# Patient Record
Sex: Male | Born: 1990 | Race: White | Hispanic: No | Marital: Single | Smoking: Never smoker
Health system: Southern US, Community
[De-identification: ages and names within clinical notes are randomized; demographics above are authoritative.]

---

## 2022-03-07 ENCOUNTER — Emergency Department
Admission: EM | Admit: 2022-03-07 | Discharge: 2022-03-07 | Disposition: A | Discharge status: Home / Self Care | Payer: Self-pay | Attending: Emergency Medicine | Admitting: Emergency Medicine

## 2022-03-07 ENCOUNTER — Emergency Department: Payer: Self-pay

## 2022-03-07 ENCOUNTER — Encounter: Payer: Self-pay | Admitting: Emergency Medicine

## 2022-03-07 ENCOUNTER — Other Ambulatory Visit: Payer: Self-pay

## 2022-03-07 DIAGNOSIS — M79672 Pain in left foot: Secondary | ICD-10-CM | POA: Insufficient documentation

## 2022-03-07 DIAGNOSIS — M7989 Other specified soft tissue disorders: Secondary | ICD-10-CM | POA: Insufficient documentation

## 2022-03-07 MED ORDER — NAPROXEN 500 MG PO TABS: 500.0000 mg | ORAL_TABLET | Freq: Two times a day (BID) | ORAL | 11 refills | Status: AC

## 2022-03-07 NOTE — Discharge Instructions (Addendum)
-  Keep the boot on while walking or working.  You may utilize the crutches as needed.  Avoid activities that worsen the pain.  Full recovery may take up to 6 to 8 weeks.  -You may take Tylenol and/or naproxen as needed for pain.  Rest and ice the foot periodically for 2 to 3 days.  -Follow-up with the orthopedist listed if your pain fails to improve after 4 to 5 weeks.  -Return to the emergency department anytime if you begin to experience any new or worsening symptoms.

## 2022-03-07 NOTE — ED Provider Notes (Signed)
[facility] Provider Note    Event Date/Time   First MD Initiated Contact with Patient 03/07/22 1534     (approximate)   History   Chief Complaint Foot Pain   HPI Ruben Hatfield is a 31 y.o. male, no remarkable medical history, presents to the emergency department for evaluation of left foot pain.  Patient states that he fell approximately 1 week ago where he slipped while it was raining, but did not have any significant pain since.  However, over the past 2 days, he has been experiencing increased pain and swelling around the dorsum of his left foot.  He states that he works at [employer] and is constantly on his feet most of the days, which worsens his pain.  Denies fever/chills, ankle pain, leg pain, knee pain, hip pain, cold sensation in the affected extremity, numbness/tingling in the affected extremity, or chest pain, shortness of breath.  Denies any recent illnesses.  History Limitations: No limitations.        Physical Exam  Triage Vital Signs: ED Triage Vitals  Enc Vitals Group     BP 03/07/22 1431 (!) 143/92     Pulse Rate 03/07/22 1431 93     Resp 03/07/22 1431 18     Temp 03/07/22 1431 97.6 F (36.4 C)     Temp Source 03/07/22 1431 Oral     SpO2 03/07/22 1431 95 %     Weight 03/07/22 1430 (!) 330 lb (149.7 kg)     Height 03/07/22 1430 5\' 9"  (1.753 m)     Head Circumference --      Peak Flow --      Pain Score 03/07/22 1430 10     Pain Loc --      Pain Edu? --      Excl. in GC? --     Most recent vital signs: Vitals:   03/07/22 1431  BP: (!) 143/92  Pulse: 93  Resp: 18  Temp: 97.6 F (36.4 C)  SpO2: 95%    General: Awake, NAD.  Skin: Warm, dry. No rashes or lesions.  Eyes: PERRL. Conjunctivae normal.  CV: Good peripheral perfusion.  Resp: Normal effort.  Abd: Soft, non-tender. No distention.  Neuro: At baseline. No gross neurological deficits.   Focused Exam: No gross deformities to the left lower extremity.  Mild  swelling appreciated along the dorsum of the left foot.  Bony tenderness along the third and fourth metatarsals.  No warmth or erythema noted.  Pulse, motor, sensation intact distally.  Patient maintains normal flexion and extension of the foot.  He is able to ambulate on his own, though with noticeable pain.   Physical Exam    ED Results / Procedures / Treatments  Labs (all labs ordered are listed, but only abnormal results are displayed) Labs Reviewed - No data to display   EKG N/A.   RADIOLOGY  ED Provider Interpretation: I personally reviewed and interpreted this plain film, no evidence of fractures or dislocations.  DG Foot Complete Left  Result Date: 03/07/2022 CLINICAL DATA:  Left foot pain and swelling, recent fall EXAM: LEFT FOOT - COMPLETE 3+ VIEW COMPARISON:  None Available. FINDINGS: There is no evidence of fracture or dislocation. There is no evidence of arthropathy or other focal bone abnormality. Soft tissues are unremarkable. IMPRESSION: Negative. Electronically Signed   By: 05/07/2022.  Shick M.D.   On: 03/07/2022 15:05    PROCEDURES:  Critical Care performed: N/A.  Procedures    MEDICATIONS  ORDERED IN ED: Medications - No data to display   IMPRESSION / MDM / ASSESSMENT AND PLAN / ED COURSE  I reviewed the triage vital signs and the nursing notes.                              Differential diagnosis includes, but is not limited to, metatarsal fracture, stress fracture, foot sprain, ankle sprain, phalangeal fracture.  ED Course Patient appears well, vitals within normal limits for the patient.  NAD.  Assessment/Plan Presentation suspicious for stress fracture of the left metatarsals, particularly the third and fourth.  X-ray does not show any obvious fractures or dislocations at this time.  He is neurovascularly intact.  We will provide him with a cam boot and crutches.  We will provide him with a prescription for naproxen, as well as provided referral to  orthopedics if his pain fails to improve after a few weeks.  Patient was amenable to this plan.  We will plan to discharge.  Patient's presentation is most consistent with acute complicated illness / injury requiring diagnostic workup.   Provided the patient with anticipatory guidance, return precautions, and educational material. Encouraged the patient to return to the emergency department at any time if they begin to experience any new or worsening symptoms. Patient expressed understanding and agreed with the plan.       FINAL CLINICAL IMPRESSION(S) / ED DIAGNOSES   Final diagnoses:  Foot pain, left     Rx / DC Orders   ED Discharge Orders          Ordered    naproxen (NAPROSYN) 500 MG tablet  2 times daily with meals        03/07/22 1613             Note:  This document was prepared using Dragon voice recognition software and may include unintentional dictation errors.   Varney Daily, [place] 03/07/22 1622    Georga Hacking, MD 03/07/22 2001

## 2022-03-07 NOTE — ED Triage Notes (Signed)
Pt via POV from home. Pt c/o L foot pain and swelling  states he fell a week ago where he slipped when it was raining but unknown if it was from that. States that it started getting worse yesterday. Pt works at [employer] and is on his feet majority of the days. Unable to bear any weight on it. Pt is A&Ox4 and NAD

## 2022-06-16 ENCOUNTER — Ambulatory Visit (INDEPENDENT_AMBULATORY_CARE_PROVIDER_SITE_OTHER): Payer: Worker's Compensation | Admitting: Podiatry

## 2022-06-16 ENCOUNTER — Ambulatory Visit (INDEPENDENT_AMBULATORY_CARE_PROVIDER_SITE_OTHER): Payer: Self-pay

## 2022-06-16 ENCOUNTER — Encounter: Payer: Self-pay | Admitting: *Deleted

## 2022-06-16 DIAGNOSIS — S99922S Unspecified injury of left foot, sequela: Secondary | ICD-10-CM

## 2022-06-16 NOTE — Progress Notes (Signed)
  Subjective:  Patient ID: Ruben Hatfield, male    DOB: [birth year],  MRN: [id number]  Chief Complaint  Patient presents with   left foot pain    left foot injury/ pain and swelling/ slipped and fell   Foot Pain    Injured foot back in June pt stated he slipped and fell and has been having pain and swelling ever since     31 y.o. male presents with the above complaint.  Patient presents with complaint of left dorsal foot contusion after injury he sustained while working for [employer] on 03/07/2022.  He has slipped and fell and is still having some pain and swelling.  He wanted to get it evaluated.  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 patient.  He denies any other acute complaints.  It hurts with ambulation sometimes is mostly swollen.  He is taking Naprosyn to help with the pain.  He has been wearing a boot for last 3 months   Review of Systems: Negative except as noted in the HPI. Denies N/V/F/Ch.  No past medical history on file.  Current Outpatient Medications:    naproxen (NAPROSYN) 500 MG tablet, Take 1 tablet (500 mg total) by mouth 2 (two) times daily with a meal., Disp: 60 tablet, Rfl: 11  Social History   Tobacco Use  Smoking Status Never  Smokeless Tobacco Current    Allergies  Allergen Reactions   Bee Venom    Penicillins    Objective:  There were no vitals filed for this visit. There is no height or weight on file to calculate BMI. Constitutional Well developed. Well nourished.  Vascular Dorsalis pedis pulses palpable bilaterally. Posterior tibial pulses palpable bilaterally. Capillary refill normal to all digits.  No cyanosis or clubbing noted. Pedal hair growth normal.  Neurologic Normal speech. Oriented to person, place, and time. Epicritic sensation to light touch grossly present bilaterally.  Dermatologic Nails well groomed and normal in appearance. No open wounds. No skin lesions.  Orthopedic: Mild pain on palpation to the dorsal lateral foot.  No pain with  range of motion of the second metatarsal phalangeal joint through fifth metatarsophalangeal joint.  No extensor or flexor tendinitis noted.   Radiographs: 3 views of skeletally mature adult left foot: No Lisfranc injury noted.  No gapping of the Lisfranc interval noted.  No bony fractures identified.  No foreign body noted.  No other bony abnormalities identified. Assessment:   1. Foot injury, left, sequela    Plan:  Patient was evaluated and treated and all questions answered.  Left foot contusion -All questions and concerns were discussed with the patient in extensive detail.  Patient states the cam boot immobilization did help but he would like to transition out of the boot.  I discussed with him he will benefit from an ankle brace to help with the transition.  Ultimately he is driving to [place] for the next month and a half and he cannot be in a boot for that long.  I discussed with him the swelling can last up to a year after the injury.  His pain is very controllable minimal. -If there is no improvement we will discuss steroid injection versus an MRI given that this has been few months since the original injury  No follow-ups on file.

## 2022-08-04 ENCOUNTER — Encounter: Payer: Self-pay | Admitting: Podiatry

## 2022-08-04 ENCOUNTER — Ambulatory Visit (INDEPENDENT_AMBULATORY_CARE_PROVIDER_SITE_OTHER): Payer: Self-pay | Admitting: Podiatry

## 2022-08-04 DIAGNOSIS — S99922S Unspecified injury of left foot, sequela: Secondary | ICD-10-CM

## 2022-08-04 NOTE — Progress Notes (Signed)
  Subjective:  Patient ID: Ruben Hatfield, male    DOB: 28-May-1991,  MRN: [id number]  Chief Complaint  Patient presents with   Foot Injury    31 y.o. male presents with the above complaint.  Patient presents with follow-up of left dorsal foot contusion.  He states that he is doing much better.  He states that he still has a little bit of residual pain but manageable.  He would like to know if he can return back to work.  Denies any other acute issues.   Review of Systems: Negative except as noted in the HPI. Denies N/V/F/Ch.  No past medical history on file.  Current Outpatient Medications:    naproxen (NAPROSYN) 500 MG tablet, Take 1 tablet (500 mg total) by mouth 2 (two) times daily with a meal., Disp: 60 tablet, Rfl: 11  Social History   Tobacco Use  Smoking Status Never  Smokeless Tobacco Current    Allergies  Allergen Reactions   Bee Venom    Penicillins    Objective:  There were no vitals filed for this visit. There is no height or weight on file to calculate BMI. Constitutional Well developed. Well nourished.  Vascular Dorsalis pedis pulses palpable bilaterally. Posterior tibial pulses palpable bilaterally. Capillary refill normal to all digits.  No cyanosis or clubbing noted. Pedal hair growth normal.  Neurologic Normal speech. Oriented to person, place, and time. Epicritic sensation to light touch grossly present bilaterally.  Dermatologic Nails well groomed and normal in appearance. No open wounds. No skin lesions.  Orthopedic: No further pain on palpation to the dorsal lateral foot.  No pain with range of motion of the second metatarsal phalangeal joint through fifth metatarsophalangeal joint.  No extensor or flexor tendinitis noted.   Radiographs: 3 views of skeletally mature adult left foot: No Lisfranc injury noted.  No gapping of the Lisfranc interval noted.  No bony fractures identified.  No foreign body noted.  No other bony abnormalities  identified. Assessment:   1. Foot injury, left, sequela     Plan:  Patient was evaluated and treated and all questions answered.  Left foot contusion -All questions and concerns were discussed with the patient in extensive detail.  Patient states that he is doing well.  He is has some residual pain overall but still doing much better.  Denies any other acute complaints.  He would like to return back to work.  If any foot and ankle issues on future of asked him to come back and see me.  He states understanding  No follow-ups on file.

## 2023-08-31 IMAGING — CR DG FOOT COMPLETE 3+V*L*
3 series · 3 of 3 positions shown · non-contrast
Comparison: None Available.

CLINICAL DATA: Left foot pain and swelling, recent fall

EXAM:
LEFT FOOT - COMPLETE 3+ VIEW

[foot ap]
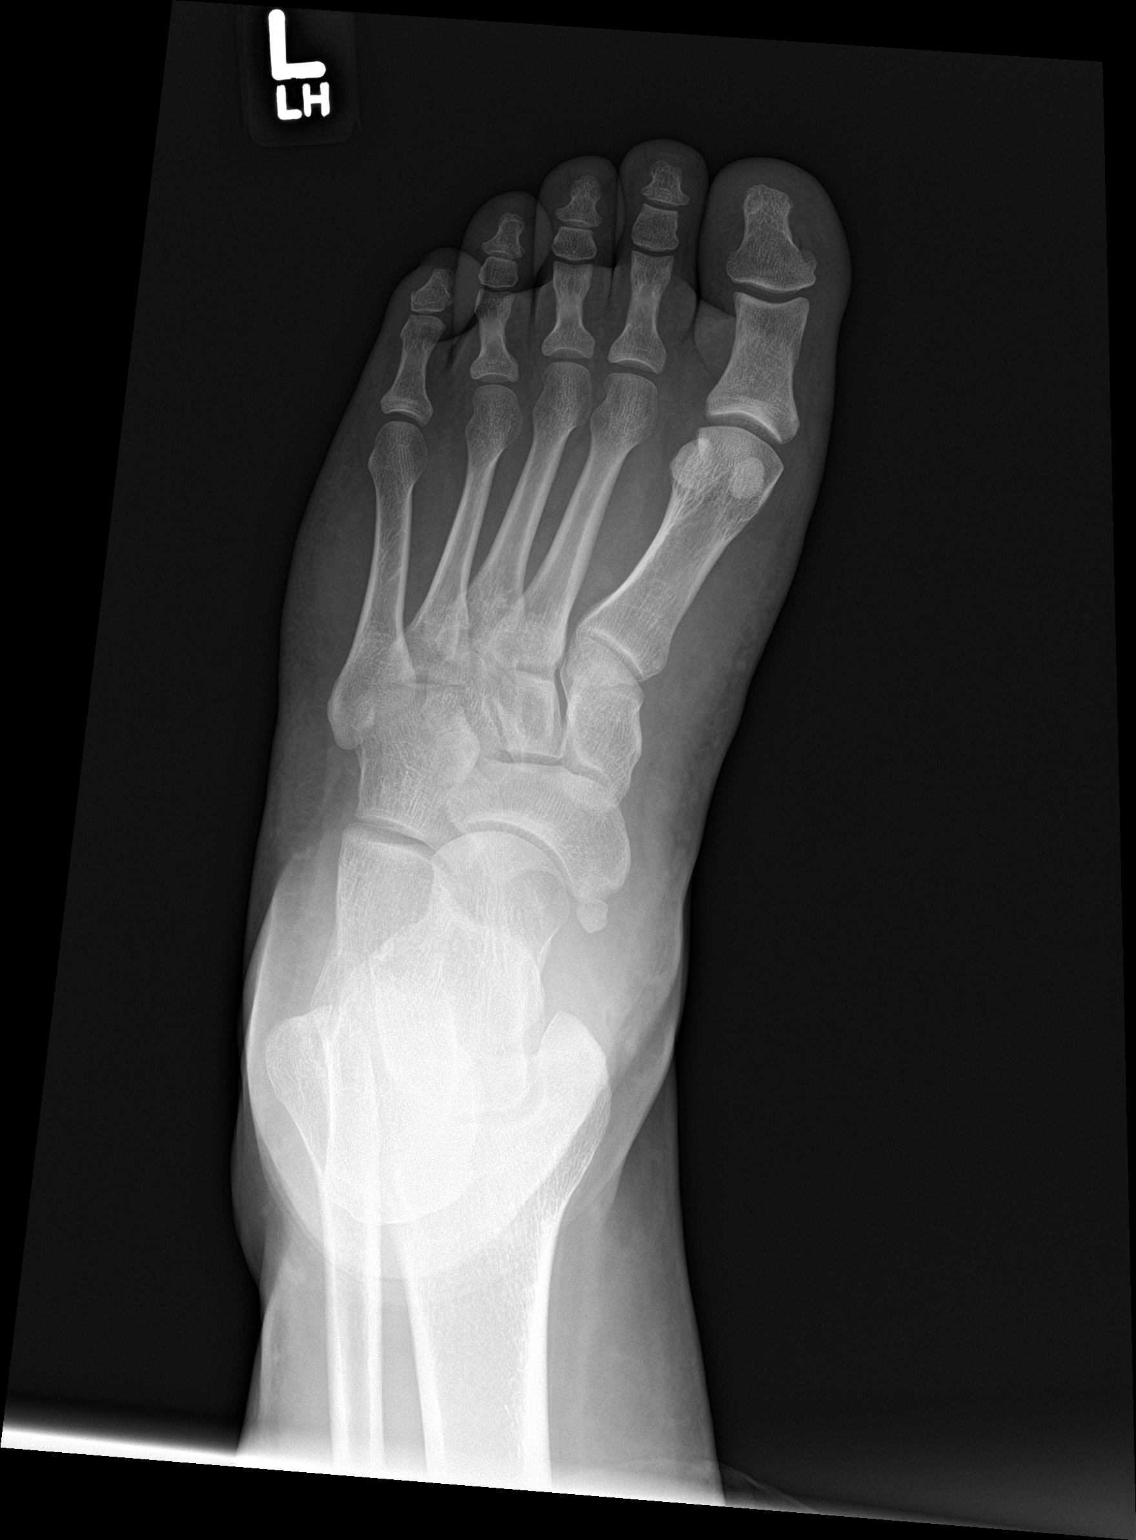

[foot obl]
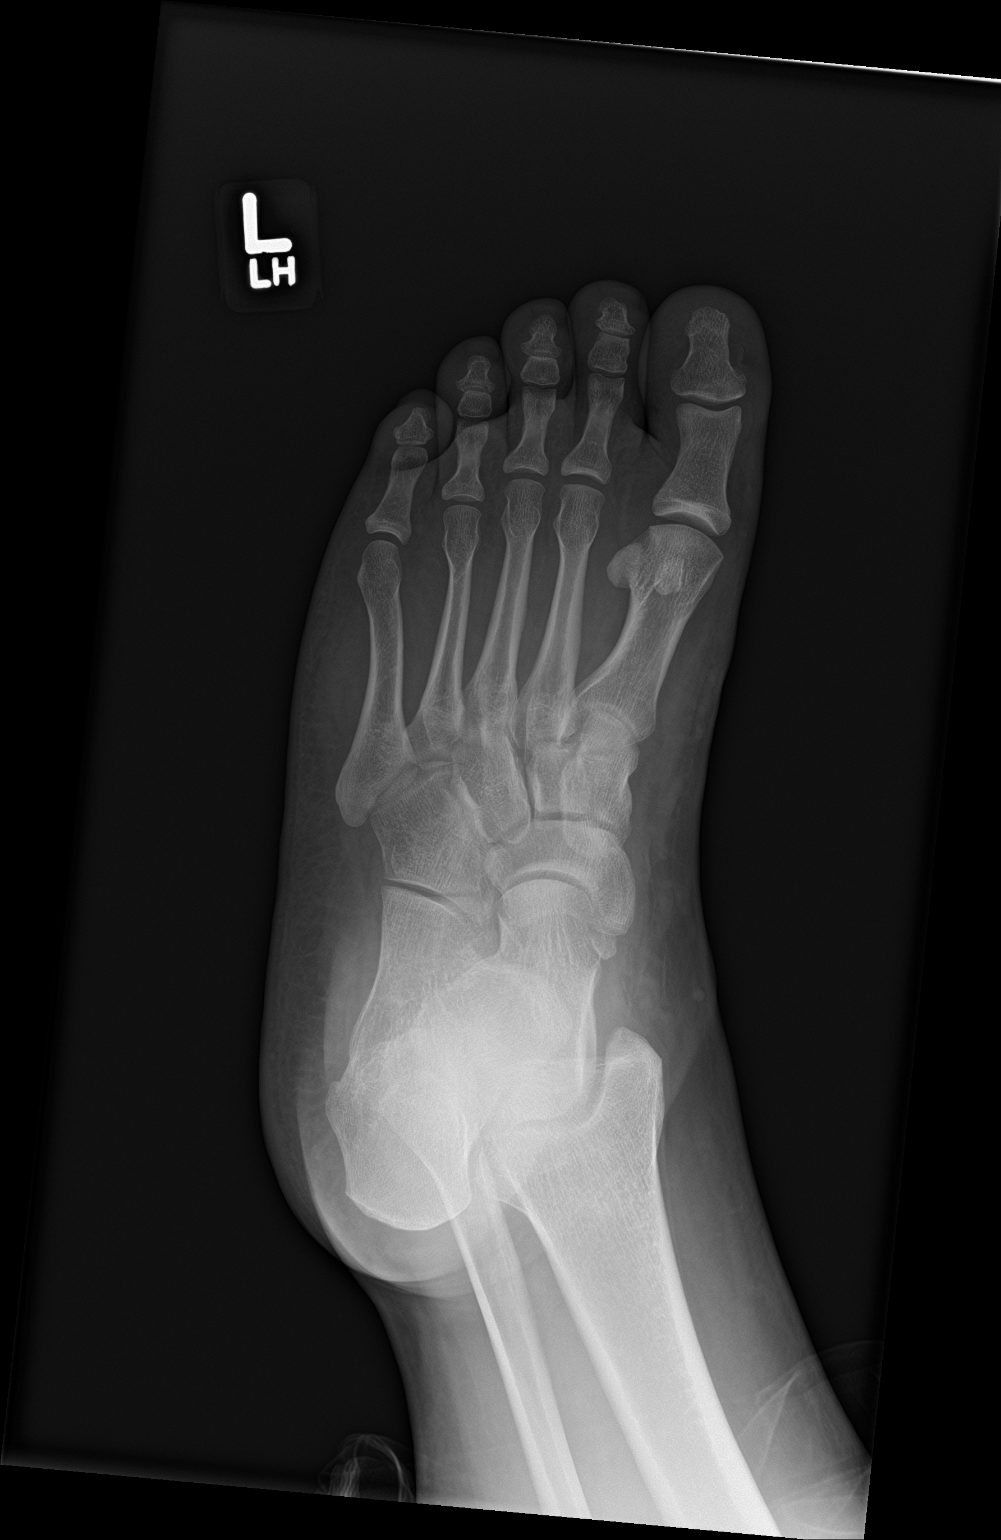

[foot lat]
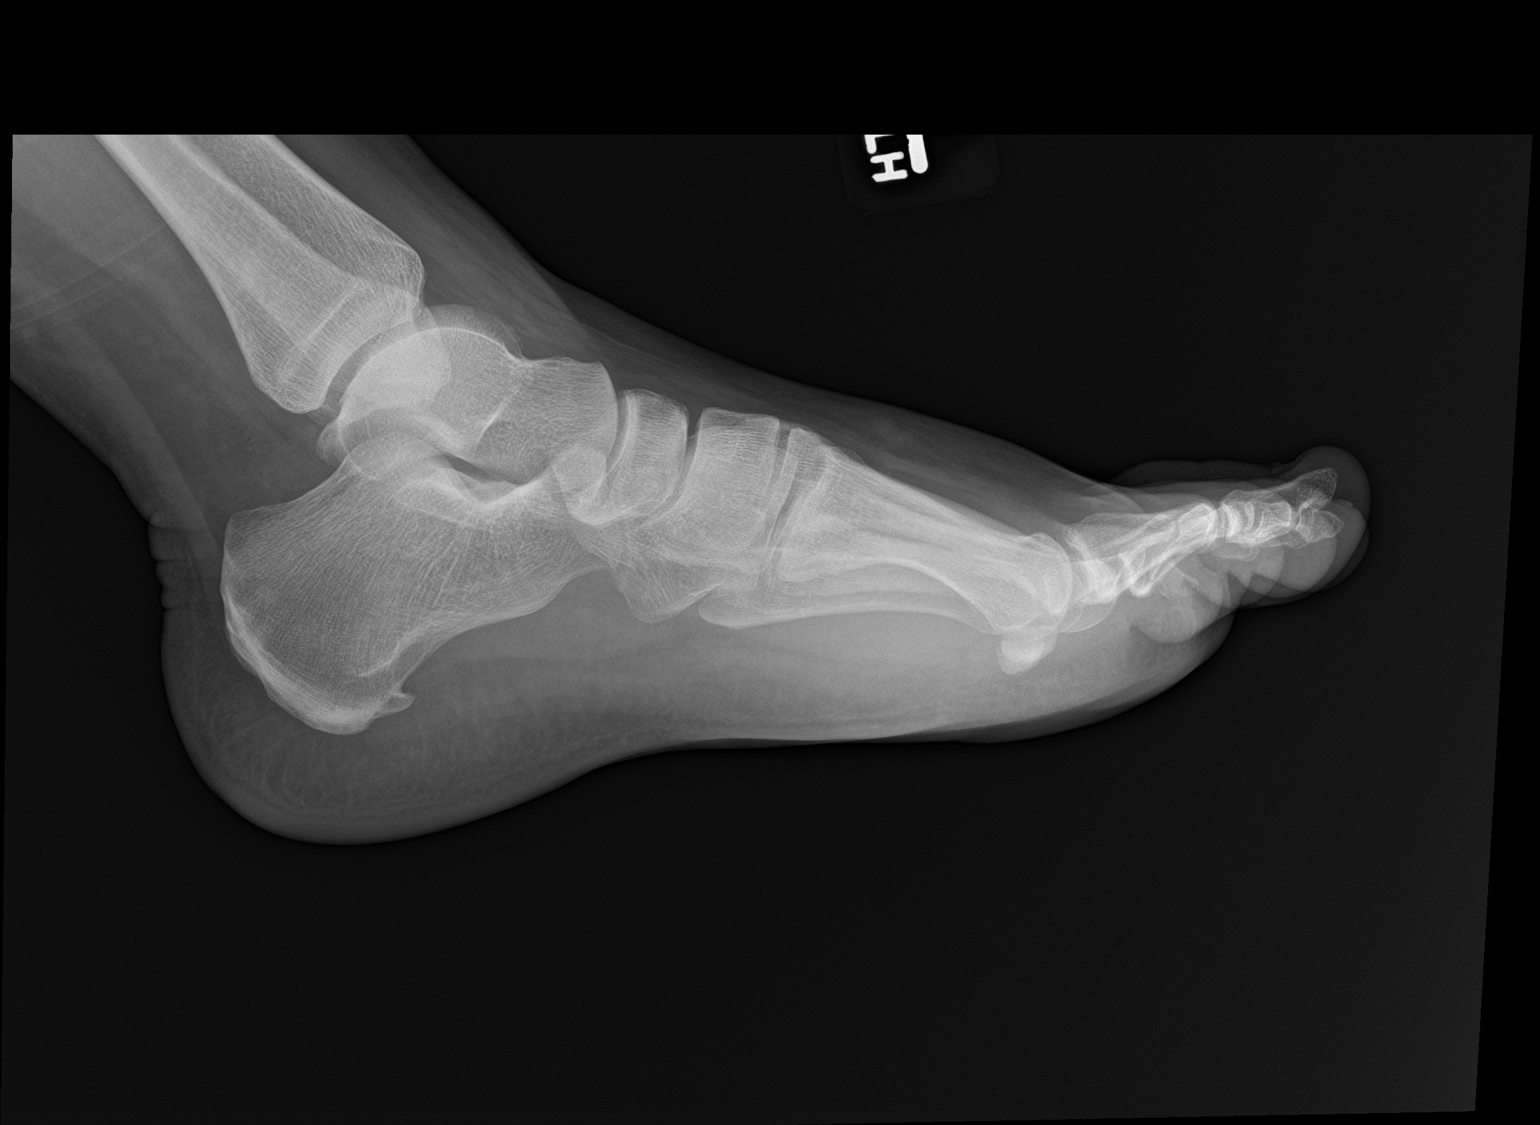

[3 of 3 positions shown; findings below may reference images not displayed]

FINDINGS: There is no evidence of fracture or dislocation. There is no
evidence of arthropathy or other focal bone abnormality. Soft
tissues are unremarkable.
IMPRESSION: Negative.
